# Patient Record
Sex: Male | Born: 1965 | Hispanic: No | Marital: Single | Smoking: Never smoker
Health system: Southern US, Community
[De-identification: ages and names within clinical notes are randomized; demographics above are authoritative.]

## PROBLEM LIST (undated history)

## (undated) DIAGNOSIS — D751 Secondary polycythemia: Secondary | ICD-10-CM

## (undated) DIAGNOSIS — R7989 Other specified abnormal findings of blood chemistry: Secondary | ICD-10-CM

## (undated) DIAGNOSIS — I10 Essential (primary) hypertension: Secondary | ICD-10-CM

## (undated) HISTORY — DX: Other specified abnormal findings of blood chemistry: R79.89

## (undated) HISTORY — DX: Secondary polycythemia: D75.1

## (undated) HISTORY — DX: Essential (primary) hypertension: I10

---

## 2009-07-03 ENCOUNTER — Ambulatory Visit: Payer: Self-pay | Admitting: Oncology

## 2009-07-07 LAB — CBC WITH DIFFERENTIAL/PLATELET
BASO%: 0.3 % (ref 0.0–2.0)
Basophils Absolute: 0 10*3/uL (ref 0.0–0.1)
EOS%: 3.1 % (ref 0.0–7.0)
MCH: 31.9 pg (ref 27.2–33.4)
MCHC: 34.3 g/dL (ref 32.0–36.0)
MCV: 93 fL (ref 79.3–98.0)
MONO%: 7 % (ref 0.0–14.0)
RBC: 5.47 10*6/uL (ref 4.20–5.82)
RDW: 13.1 % (ref 11.0–14.6)
lymph#: 2.1 10*3/uL (ref 0.9–3.3)

## 2009-07-11 LAB — COMPREHENSIVE METABOLIC PANEL
ALT: 37 U/L (ref 0–53)
AST: 28 U/L (ref 0–37)
Albumin: 4.5 g/dL (ref 3.5–5.2)
Alkaline Phosphatase: 63 U/L (ref 39–117)
BUN: 14 mg/dL (ref 6–23)
Chloride: 101 mEq/L (ref 96–112)
Potassium: 3.7 mEq/L (ref 3.5–5.3)
Sodium: 139 mEq/L (ref 135–145)

## 2009-07-11 LAB — HEMOCHROMATOSIS DNA-PCR(C282Y,H63D)

## 2009-07-11 LAB — IRON AND TIBC
%SAT: 32 % (ref 20–55)
TIBC: 349 ug/dL (ref 215–435)
UIBC: 237 ug/dL

## 2009-07-11 LAB — FERRITIN: Ferritin: 684 ng/mL — ABNORMAL HIGH (ref 22–322)

## 2009-07-31 ENCOUNTER — Ambulatory Visit: Payer: Self-pay | Admitting: Oncology

## 2009-08-05 LAB — CBC WITH DIFFERENTIAL/PLATELET
Basophils Absolute: 0 10*3/uL (ref 0.0–0.1)
Eosinophils Absolute: 0.1 10*3/uL (ref 0.0–0.5)
HGB: 16 g/dL (ref 13.0–17.1)
MCV: 92 fL (ref 79.3–98.0)
MONO#: 0.5 10*3/uL (ref 0.1–0.9)
MONO%: 8.4 % (ref 0.0–14.0)
NEUT#: 3.6 10*3/uL (ref 1.5–6.5)
RBC: 5.01 10*6/uL (ref 4.20–5.82)
RDW: 12.4 % (ref 11.0–14.6)
WBC: 6.2 10*3/uL (ref 4.0–10.3)

## 2009-08-05 LAB — SEDIMENTATION RATE: Sed Rate: 2 mm/hr (ref 0–16)

## 2009-08-05 LAB — IRON AND TIBC
%SAT: 36 % (ref 20–55)
Iron: 113 ug/dL (ref 42–165)
TIBC: 314 ug/dL (ref 215–435)
UIBC: 201 ug/dL

## 2009-08-05 LAB — FERRITIN: Ferritin: 654 ng/mL — ABNORMAL HIGH (ref 22–322)

## 2009-12-09 ENCOUNTER — Ambulatory Visit: Payer: Self-pay | Admitting: Oncology

## 2009-12-11 LAB — CBC WITH DIFFERENTIAL/PLATELET
BASO%: 0.3 % (ref 0.0–2.0)
EOS%: 2.1 % (ref 0.0–7.0)
MCH: 31.5 pg (ref 27.2–33.4)
MCHC: 34.3 g/dL (ref 32.0–36.0)
MONO#: 0.6 10*3/uL (ref 0.1–0.9)
RBC: 5.31 10*6/uL (ref 4.20–5.82)
RDW: 12.9 % (ref 11.0–14.6)
WBC: 6.9 10*3/uL (ref 4.0–10.3)
lymph#: 2.3 10*3/uL (ref 0.9–3.3)

## 2009-12-11 LAB — COMPREHENSIVE METABOLIC PANEL
ALT: 40 U/L (ref 0–53)
AST: 35 U/L (ref 0–37)
Albumin: 4 g/dL (ref 3.5–5.2)
Alkaline Phosphatase: 75 U/L (ref 39–117)
Calcium: 9.6 mg/dL (ref 8.4–10.5)
Chloride: 106 mEq/L (ref 96–112)
Potassium: 4 mEq/L (ref 3.5–5.3)

## 2009-12-11 LAB — LACTATE DEHYDROGENASE: LDH: 153 U/L (ref 94–250)

## 2009-12-11 LAB — IRON AND TIBC
%SAT: 39 % (ref 20–55)
TIBC: 327 ug/dL (ref 215–435)

## 2010-03-10 ENCOUNTER — Ambulatory Visit: Payer: Self-pay | Admitting: Oncology

## 2010-03-13 LAB — IRON AND TIBC
%SAT: 40 % (ref 20–55)
UIBC: 217 ug/dL

## 2010-03-13 LAB — FERRITIN: Ferritin: 759 ng/mL — ABNORMAL HIGH (ref 22–322)

## 2010-06-09 ENCOUNTER — Ambulatory Visit: Payer: Self-pay | Admitting: Oncology

## 2010-06-11 LAB — COMPREHENSIVE METABOLIC PANEL
CO2: 26 mEq/L (ref 19–32)
Calcium: 9.7 mg/dL (ref 8.4–10.5)
Chloride: 104 mEq/L (ref 96–112)
Creatinine, Ser: 1.29 mg/dL (ref 0.40–1.50)
Glucose, Bld: 119 mg/dL — ABNORMAL HIGH (ref 70–99)
Total Bilirubin: 1.7 mg/dL — ABNORMAL HIGH (ref 0.3–1.2)
Total Protein: 7.3 g/dL (ref 6.0–8.3)

## 2010-06-11 LAB — CBC WITH DIFFERENTIAL/PLATELET
Eosinophils Absolute: 0.1 10*3/uL (ref 0.0–0.5)
HCT: 49.9 % (ref 38.4–49.9)
HGB: 17.2 g/dL — ABNORMAL HIGH (ref 13.0–17.1)
LYMPH%: 33.6 % (ref 14.0–49.0)
MONO#: 0.5 10*3/uL (ref 0.1–0.9)
NEUT#: 4.2 10*3/uL (ref 1.5–6.5)
NEUT%: 56.9 % (ref 39.0–75.0)
Platelets: 217 10*3/uL (ref 140–400)
WBC: 7.4 10*3/uL (ref 4.0–10.3)

## 2010-06-11 LAB — IRON AND TIBC
Iron: 87 ug/dL (ref 42–165)
UIBC: 233 ug/dL

## 2010-06-11 LAB — LACTATE DEHYDROGENASE: LDH: 151 U/L (ref 94–250)

## 2010-06-11 LAB — FERRITIN: Ferritin: 619 ng/mL — ABNORMAL HIGH (ref 22–322)

## 2010-06-14 ENCOUNTER — Other Ambulatory Visit (HOSPITAL_COMMUNITY): Payer: Self-pay | Admitting: Oncology

## 2010-06-18 ENCOUNTER — Inpatient Hospital Stay (HOSPITAL_COMMUNITY): Admission: RE | Admit: 2010-06-18 | Payer: Self-pay | Source: Ambulatory Visit

## 2010-06-24 ENCOUNTER — Ambulatory Visit (HOSPITAL_COMMUNITY)
Admission: RE | Admit: 2010-06-24 | Discharge: 2010-06-24 | Disposition: A | Discharge status: Home / Self Care | Payer: BC Managed Care – PPO | Source: Ambulatory Visit | Attending: Oncology | Admitting: Oncology

## 2010-06-24 DIAGNOSIS — R799 Abnormal finding of blood chemistry, unspecified: Secondary | ICD-10-CM | POA: Insufficient documentation

## 2010-06-24 DIAGNOSIS — K7689 Other specified diseases of liver: Secondary | ICD-10-CM | POA: Insufficient documentation

## 2010-06-24 DIAGNOSIS — Q619 Cystic kidney disease, unspecified: Secondary | ICD-10-CM | POA: Insufficient documentation

## 2010-07-13 ENCOUNTER — Encounter (HOSPITAL_BASED_OUTPATIENT_CLINIC_OR_DEPARTMENT_OTHER): Payer: BC Managed Care – PPO | Admitting: Oncology

## 2010-07-13 ENCOUNTER — Other Ambulatory Visit (HOSPITAL_COMMUNITY): Payer: Self-pay | Admitting: Oncology

## 2010-07-13 DIAGNOSIS — R799 Abnormal finding of blood chemistry, unspecified: Secondary | ICD-10-CM

## 2010-07-13 DIAGNOSIS — Z79899 Other long term (current) drug therapy: Secondary | ICD-10-CM

## 2010-07-13 LAB — CBC WITH DIFFERENTIAL/PLATELET
Basophils Absolute: 0 10*3/uL (ref 0.0–0.1)
EOS%: 3.5 % (ref 0.0–7.0)
HCT: 48.6 % (ref 38.4–49.9)
HGB: 16.8 g/dL (ref 13.0–17.1)
LYMPH%: 31.4 % (ref 14.0–49.0)
MCH: 31.6 pg (ref 27.2–33.4)
MCHC: 34.5 g/dL (ref 32.0–36.0)
MCV: 91.8 fL (ref 79.3–98.0)
MONO%: 8.4 % (ref 0.0–14.0)
NEUT%: 56.4 % (ref 39.0–75.0)

## 2010-08-06 ENCOUNTER — Other Ambulatory Visit (HOSPITAL_COMMUNITY): Payer: Self-pay | Admitting: Oncology

## 2010-08-06 ENCOUNTER — Encounter (HOSPITAL_BASED_OUTPATIENT_CLINIC_OR_DEPARTMENT_OTHER): Payer: BC Managed Care – PPO | Admitting: Oncology

## 2010-08-06 DIAGNOSIS — R799 Abnormal finding of blood chemistry, unspecified: Secondary | ICD-10-CM

## 2010-08-06 DIAGNOSIS — Z79899 Other long term (current) drug therapy: Secondary | ICD-10-CM

## 2010-08-06 LAB — CBC WITH DIFFERENTIAL/PLATELET
EOS%: 1.9 % (ref 0.0–7.0)
Eosinophils Absolute: 0.1 10*3/uL (ref 0.0–0.5)
LYMPH%: 34.6 % (ref 14.0–49.0)
MCH: 31.7 pg (ref 27.2–33.4)
MCHC: 34.3 g/dL (ref 32.0–36.0)
MCV: 92.4 fL (ref 79.3–98.0)
MONO%: 8.2 % (ref 0.0–14.0)
Platelets: 215 10*3/uL (ref 140–400)
RBC: 5 10*6/uL (ref 4.20–5.82)

## 2010-09-07 ENCOUNTER — Other Ambulatory Visit (HOSPITAL_COMMUNITY): Payer: Self-pay | Admitting: Oncology

## 2010-09-07 ENCOUNTER — Encounter (HOSPITAL_BASED_OUTPATIENT_CLINIC_OR_DEPARTMENT_OTHER): Payer: BC Managed Care – PPO | Admitting: Oncology

## 2010-09-07 DIAGNOSIS — Z79899 Other long term (current) drug therapy: Secondary | ICD-10-CM

## 2010-09-07 DIAGNOSIS — R799 Abnormal finding of blood chemistry, unspecified: Secondary | ICD-10-CM

## 2010-09-07 LAB — COMPREHENSIVE METABOLIC PANEL
BUN: 15 mg/dL (ref 6–23)
CO2: 27 mEq/L (ref 19–32)
Creatinine, Ser: 1.38 mg/dL (ref 0.40–1.50)
Glucose, Bld: 119 mg/dL — ABNORMAL HIGH (ref 70–99)
Total Bilirubin: 1.4 mg/dL — ABNORMAL HIGH (ref 0.3–1.2)

## 2010-09-07 LAB — CBC WITH DIFFERENTIAL/PLATELET
Eosinophils Absolute: 0.1 10*3/uL (ref 0.0–0.5)
HCT: 48.4 % (ref 38.4–49.9)
LYMPH%: 35.6 % (ref 14.0–49.0)
MCV: 93.6 fL (ref 79.3–98.0)
MONO#: 0.5 10*3/uL (ref 0.1–0.9)
MONO%: 7.1 % (ref 0.0–14.0)
NEUT#: 3.6 10*3/uL (ref 1.5–6.5)
NEUT%: 54.7 % (ref 39.0–75.0)
Platelets: 224 10*3/uL (ref 140–400)
WBC: 6.6 10*3/uL (ref 4.0–10.3)

## 2010-09-07 LAB — IRON AND TIBC
Iron: 129 ug/dL (ref 42–165)
TIBC: 378 ug/dL (ref 215–435)
UIBC: 249 ug/dL

## 2010-09-07 LAB — LACTATE DEHYDROGENASE: LDH: 150 U/L (ref 94–250)

## 2010-09-07 LAB — FERRITIN: Ferritin: 246 ng/mL (ref 22–322)

## 2010-10-05 ENCOUNTER — Other Ambulatory Visit (HOSPITAL_COMMUNITY): Payer: Self-pay | Admitting: Oncology

## 2010-10-05 ENCOUNTER — Encounter (HOSPITAL_BASED_OUTPATIENT_CLINIC_OR_DEPARTMENT_OTHER): Payer: BC Managed Care – PPO | Admitting: Oncology

## 2010-10-05 DIAGNOSIS — R799 Abnormal finding of blood chemistry, unspecified: Secondary | ICD-10-CM

## 2010-10-05 LAB — CBC WITH DIFFERENTIAL/PLATELET
Basophils Absolute: 0 10*3/uL (ref 0.0–0.1)
EOS%: 2.8 % (ref 0.0–7.0)
HGB: 16.1 g/dL (ref 13.0–17.1)
MCH: 31.6 pg (ref 27.2–33.4)
MCHC: 34.2 g/dL (ref 32.0–36.0)
MCV: 92.2 fL (ref 79.3–98.0)
MONO%: 7.9 % (ref 0.0–14.0)
RBC: 5.1 10*6/uL (ref 4.20–5.82)
RDW: 12.8 % (ref 11.0–14.6)

## 2010-10-05 LAB — FERRITIN: Ferritin: 164 ng/mL (ref 22–322)

## 2010-11-02 ENCOUNTER — Other Ambulatory Visit (HOSPITAL_COMMUNITY): Payer: Self-pay | Admitting: Oncology

## 2010-11-02 ENCOUNTER — Encounter (HOSPITAL_BASED_OUTPATIENT_CLINIC_OR_DEPARTMENT_OTHER): Payer: BC Managed Care – PPO | Admitting: Oncology

## 2010-11-02 DIAGNOSIS — R799 Abnormal finding of blood chemistry, unspecified: Secondary | ICD-10-CM

## 2010-11-30 ENCOUNTER — Other Ambulatory Visit (HOSPITAL_COMMUNITY): Payer: Self-pay | Admitting: Oncology

## 2010-11-30 ENCOUNTER — Encounter (HOSPITAL_BASED_OUTPATIENT_CLINIC_OR_DEPARTMENT_OTHER): Payer: BC Managed Care – PPO | Admitting: Oncology

## 2010-11-30 DIAGNOSIS — R799 Abnormal finding of blood chemistry, unspecified: Secondary | ICD-10-CM

## 2010-11-30 LAB — COMPREHENSIVE METABOLIC PANEL
ALT: 33 U/L (ref 0–53)
AST: 26 U/L (ref 0–37)
Albumin: 3.9 g/dL (ref 3.5–5.2)
Alkaline Phosphatase: 81 U/L (ref 39–117)
Potassium: 3.5 mEq/L (ref 3.5–5.3)
Sodium: 140 mEq/L (ref 135–145)
Total Protein: 7.3 g/dL (ref 6.0–8.3)

## 2010-11-30 LAB — CBC WITH DIFFERENTIAL/PLATELET
EOS%: 1.9 % (ref 0.0–7.0)
Eosinophils Absolute: 0.1 10*3/uL (ref 0.0–0.5)
MCH: 31 pg (ref 27.2–33.4)
MCV: 91.4 fL (ref 79.3–98.0)
MONO%: 7.5 % (ref 0.0–14.0)
NEUT#: 3.9 10*3/uL (ref 1.5–6.5)
RBC: 5.16 10*6/uL (ref 4.20–5.82)
RDW: 13.2 % (ref 11.0–14.6)

## 2010-11-30 LAB — IRON AND TIBC
%SAT: 34 % (ref 20–55)
TIBC: 385 ug/dL (ref 215–435)

## 2011-02-03 ENCOUNTER — Other Ambulatory Visit (HOSPITAL_COMMUNITY): Payer: Self-pay | Admitting: Oncology

## 2011-02-03 ENCOUNTER — Encounter (HOSPITAL_BASED_OUTPATIENT_CLINIC_OR_DEPARTMENT_OTHER): Payer: BC Managed Care – PPO | Admitting: Oncology

## 2011-02-03 DIAGNOSIS — R799 Abnormal finding of blood chemistry, unspecified: Secondary | ICD-10-CM

## 2011-02-03 LAB — CBC WITH DIFFERENTIAL/PLATELET
Basophils Absolute: 0 10*3/uL (ref 0.0–0.1)
EOS%: 3 % (ref 0.0–7.0)
HCT: 49.6 % (ref 38.4–49.9)
HGB: 17.1 g/dL (ref 13.0–17.1)
MONO#: 0.6 10*3/uL (ref 0.1–0.9)
NEUT#: 3.4 10*3/uL (ref 1.5–6.5)
NEUT%: 52.4 % (ref 39.0–75.0)
RDW: 13.1 % (ref 11.0–14.6)
WBC: 6.4 10*3/uL (ref 4.0–10.3)
lymph#: 2.3 10*3/uL (ref 0.9–3.3)

## 2011-03-31 ENCOUNTER — Other Ambulatory Visit (HOSPITAL_COMMUNITY): Payer: Self-pay | Admitting: Oncology

## 2011-03-31 ENCOUNTER — Other Ambulatory Visit (HOSPITAL_BASED_OUTPATIENT_CLINIC_OR_DEPARTMENT_OTHER): Payer: BC Managed Care – PPO | Admitting: Lab

## 2011-03-31 DIAGNOSIS — R799 Abnormal finding of blood chemistry, unspecified: Secondary | ICD-10-CM

## 2011-03-31 LAB — CBC WITH DIFFERENTIAL/PLATELET
Basophils Absolute: 0 10*3/uL (ref 0.0–0.1)
Eosinophils Absolute: 0.1 10*3/uL (ref 0.0–0.5)
HGB: 17 g/dL (ref 13.0–17.1)
LYMPH%: 36.8 % (ref 14.0–49.0)
MCV: 92.3 fL (ref 79.3–98.0)
MONO#: 0.6 10*3/uL (ref 0.1–0.9)
MONO%: 8.7 % (ref 0.0–14.0)
NEUT#: 3.8 10*3/uL (ref 1.5–6.5)
Platelets: 224 10*3/uL (ref 140–400)
RBC: 5.49 10*6/uL (ref 4.20–5.82)
RDW: 13.1 % (ref 11.0–14.6)
WBC: 7.2 10*3/uL (ref 4.0–10.3)

## 2011-03-31 LAB — FERRITIN: Ferritin: 149 ng/mL (ref 22–322)

## 2011-04-05 ENCOUNTER — Other Ambulatory Visit: Payer: Self-pay | Admitting: Oncology

## 2011-05-14 ENCOUNTER — Telehealth: Payer: Self-pay | Admitting: Oncology

## 2011-05-14 NOTE — Telephone Encounter (Signed)
na at above #,appt letter mailed to pt for 06/28/11 appt per mosaig    aom

## 2011-06-28 ENCOUNTER — Encounter: Payer: Self-pay | Admitting: Oncology

## 2011-06-28 ENCOUNTER — Ambulatory Visit (HOSPITAL_BASED_OUTPATIENT_CLINIC_OR_DEPARTMENT_OTHER): Payer: BC Managed Care – PPO | Admitting: Oncology

## 2011-06-28 ENCOUNTER — Other Ambulatory Visit: Payer: BC Managed Care – PPO | Admitting: Lab

## 2011-06-28 DIAGNOSIS — R799 Abnormal finding of blood chemistry, unspecified: Secondary | ICD-10-CM

## 2011-06-28 DIAGNOSIS — R7989 Other specified abnormal findings of blood chemistry: Secondary | ICD-10-CM | POA: Insufficient documentation

## 2011-06-28 DIAGNOSIS — D751 Secondary polycythemia: Secondary | ICD-10-CM | POA: Insufficient documentation

## 2011-06-28 LAB — CBC WITH DIFFERENTIAL/PLATELET
BASO%: 0.3 % (ref 0.0–2.0)
EOS%: 1.6 % (ref 0.0–7.0)
Eosinophils Absolute: 0.1 10*3/uL (ref 0.0–0.5)
LYMPH%: 35.2 % (ref 14.0–49.0)
MCH: 31.4 pg (ref 27.2–33.4)
MCHC: 34 g/dL (ref 32.0–36.0)
MCV: 92.3 fL (ref 79.3–98.0)
MONO%: 9.6 % (ref 0.0–14.0)
NEUT#: 4.3 10*3/uL (ref 1.5–6.5)
Platelets: 211 10*3/uL (ref 140–400)
RBC: 5.51 10*6/uL (ref 4.20–5.82)
RDW: 12.9 % (ref 11.0–14.6)

## 2011-06-28 LAB — IRON AND TIBC
Iron: 108 ug/dL (ref 42–165)
TIBC: 362 ug/dL (ref 215–435)
UIBC: 254 ug/dL (ref 125–400)

## 2011-06-28 LAB — COMPREHENSIVE METABOLIC PANEL
AST: 32 U/L (ref 0–37)
Albumin: 3.9 g/dL (ref 3.5–5.2)
Alkaline Phosphatase: 73 U/L (ref 39–117)
Glucose, Bld: 105 mg/dL — ABNORMAL HIGH (ref 70–99)
Potassium: 3.3 mEq/L — ABNORMAL LOW (ref 3.5–5.3)
Sodium: 141 mEq/L (ref 135–145)
Total Bilirubin: 1 mg/dL (ref 0.3–1.2)
Total Protein: 7.8 g/dL (ref 6.0–8.3)

## 2011-06-28 LAB — FERRITIN: Ferritin: 198 ng/mL (ref 22–322)

## 2011-06-28 NOTE — Progress Notes (Signed)
This office note has been dictated.  #[id number]

## 2011-06-29 ENCOUNTER — Telehealth: Payer: Self-pay | Admitting: Oncology

## 2011-06-29 ENCOUNTER — Other Ambulatory Visit: Payer: Self-pay

## 2011-06-29 ENCOUNTER — Other Ambulatory Visit: Payer: Self-pay | Admitting: Oncology

## 2011-06-29 DIAGNOSIS — R7989 Other specified abnormal findings of blood chemistry: Secondary | ICD-10-CM

## 2011-06-29 NOTE — Clinical Documentation Improvement (Signed)
Ferritin from 06/28/11 was 198, up from 149 on 03/31/11.  I recommended that patient have a phlebotomy.  To be done possibly today, 06/29/11.

## 2011-06-29 NOTE — Progress Notes (Signed)
CC:   Jose Shanks, MD  PROBLEM LIST: 1. Elevated ferritin level, possibly due to hemochromatosis although     the HFE gene mutations, specifically the C2A2Y and H63D mutations     were not detected.  The patient has undergone 1 unit phlebotomies     beginning on June 15, 2010, 07/13/2010, 08/06/2010, 10/05/2010,     11/02/2010, and 11/30/2010.  Ferritin level on 03/12/2010 was 759.     It was felt that the patient had hemochromatosis on the basis of a     rising ferritin level without obvious explanation.  The patient     denied any supplemental iron, and we had no other alternative for     the elevated ferritin.  Specifically, there was no evidence for     infection, inflammation or cancer. 2. Erythrocytosis detected February 2011.  JAK2 mutation was not     detected.  Abdominal ultrasound on 06/24/2010 did not show     splenomegaly. 3. Hypertension. 4. GERD.  MEDICATIONS: 1. Chlorthalidone 25 mg daily. 2. Tylenol as needed.  HISTORY:  Jose Stanley returns for evaluation of his elevated ferritin level which we believe is due to hemochromatosis despite the absence of mutations in the HFE gene.  Jose Stanley was last seen by [place] on 11/30/2010 at the time of his most recent phlebotomy, his 7th since starting on a phlebotomy program on 06/15/2010.  At that time, Jose Stanley did not wish to have any more phlebotomies.  He wanted to monitor his labs every couple of months, and follow up with [place] 6 months later.  He denies any changes in his condition.  He is a Naval architect for [employer].  PHYSICAL EXAMINATION:  General:  There is little change.  Vital Signs: Weight is 242 pounds.  Height 5 feet 9 inches.  Body surface area 2.31 sq m.  Blood pressure 132/82.  Other vital signs are normal. Temperature is 99.2.  HEENT:  There is conjunctival injection bilaterally.  Mouth and pharynx are benign.  No peripheral adenopathy palpable.  Heart and lungs are normal.  Abdomen:  Soft, nontender with no  organomegaly or masses palpable.  Extremities:  No peripheral edema. Neurologic:  Exam is grossly normal.  LABORATORY DATA:  Today, white count 8.0, ANC 4.3, hemoglobin 17.3, hematocrit 50.8, platelets 211,000.  MCV and MCH are normal. Chemistries today notable for BUN of 15, creatinine 1.52, potassium 3.3. Liver function tests are normal.  Albumin 3.9.  LDH 171.  Iron studies are pending.  Ferritin on 03/31/2011 was 149 as compared with 86 on 02/03/2011, 90 on 11/30/2010, 120 on 11/02/2010, 164 on 10/05/2010, 246 on 09/07/2010, 410 on 08/06/2010, 480 on 07/13/2010, 619 on 06/11/2010, 759 on 03/12/2010, and 684 on 07/07/2009.  IMAGING STUDIES:  Ultrasound of the abdomen on 06/24/2010 showed a normal size spleen measuring 6.7 cm.  Right kidney was normal.  There was a single simple cyst measuring 1.3 cm in the midpole of the left kidney without hydronephrosis or stone.  Liver showed no focal lesions. The right hepatic lobe demonstrated increased echogenicity.  The left hepatic lobe was not well visualized.  IMPRESSION AND PLAN:  We are awaiting the results of today's iron studies.  If it appears that the ferritin is increasing, particularly if it is in the vicinity of 200, then we will be arranging for Jose Stanley to undergo additional phlebotomies.  He is agreeable to this.  He would like to be called with his lab results.  Jose Stanley wanted to have his  iron studies followed through his primary care physician.  I suggested that those results be faxed to [place].  We arranged for follow-up appointment in 6 months at which time we will check CBC, chemistries, and iron studies.  Once again we reviewed possible iron supplements.  Jose Stanley not only does not take iron supplements but he has actually tried to eliminate meat and other potential dietary sources of iron from his intake.  The trends that we have seen in [name]'s iron particularly the upward trend now that we have stopped the phlebotomy program  suggests to me the possibility of increased abnormal iron absorption.  Ferritin came back at 198.  Jose Stanley will have a 1 unit phlebotomy on or about 07/01/11.  He will have a CBC and ferritin in 6-8 weeks.  ______________________________ Samul Dada, M.D. DSM/MEDQ  D:  06/28/2011  T:  06/29/2011  Job:  [id number]

## 2011-06-29 NOTE — Telephone Encounter (Signed)
called pt with phleb appt on 2/15 and lab md on 8/12  aom

## 2011-07-01 ENCOUNTER — Ambulatory Visit (HOSPITAL_BASED_OUTPATIENT_CLINIC_OR_DEPARTMENT_OTHER): Payer: BC Managed Care – PPO

## 2011-07-01 DIAGNOSIS — R7989 Other specified abnormal findings of blood chemistry: Secondary | ICD-10-CM

## 2011-07-01 DIAGNOSIS — D759 Disease of blood and blood-forming organs, unspecified: Secondary | ICD-10-CM

## 2011-07-01 NOTE — Progress Notes (Signed)
Phlebotomy done at 1600.  200cc obtained from left AC, and clotted off.  2nd stick done in left forearm, additional 300cc obtained for a total of 500 cc blood.

## 2011-12-26 ENCOUNTER — Other Ambulatory Visit: Payer: BC Managed Care – PPO | Admitting: Lab

## 2011-12-26 ENCOUNTER — Ambulatory Visit: Payer: BC Managed Care – PPO | Admitting: Family

## 2011-12-26 ENCOUNTER — Encounter: Payer: Self-pay | Admitting: Family

## 2012-03-22 ENCOUNTER — Encounter: Payer: Self-pay | Admitting: Oncology

## 2012-03-22 ENCOUNTER — Telehealth: Payer: Self-pay | Admitting: Oncology

## 2012-03-22 ENCOUNTER — Other Ambulatory Visit: Payer: Self-pay

## 2012-03-22 NOTE — Progress Notes (Signed)
This patient was last seen by me on 06/28/2011 and underwent a phlebotomy of one unit on February 15. He was a no-show on 12/26/2011.  We have labs sent to [place] from Iona Hansen NP: From 03/19/2012, ferritin was 191.1, hemoglobin was 17.5 with normal being 11.9-15.8 and hematocrit was 52.5 with normal being 35-60.  An appointment was made for the patient to see me on November 15. I don't think we have ordered any labs that date. We plan to discuss management with the patient and then if he is agreeable to having more phlebotomies, we will probably order more labs to recheck these values.

## 2012-03-22 NOTE — Telephone Encounter (Signed)
lmonvm adviisng the pt of his appt with dr Arline Asp on 03/30/2012

## 2012-03-30 ENCOUNTER — Encounter: Payer: Self-pay | Admitting: Oncology

## 2012-03-30 ENCOUNTER — Telehealth: Payer: Self-pay | Admitting: Oncology

## 2012-03-30 ENCOUNTER — Other Ambulatory Visit: Payer: BC Managed Care – PPO | Admitting: Lab

## 2012-03-30 ENCOUNTER — Ambulatory Visit (HOSPITAL_BASED_OUTPATIENT_CLINIC_OR_DEPARTMENT_OTHER): Payer: BC Managed Care – PPO | Admitting: Oncology

## 2012-03-30 ENCOUNTER — Ambulatory Visit (HOSPITAL_BASED_OUTPATIENT_CLINIC_OR_DEPARTMENT_OTHER): Payer: BC Managed Care – PPO

## 2012-03-30 VITALS — BP 118/72 | HR 100 | Temp 98.4°F | Resp 20 | Ht 69.0 in | Wt 236.0 lb

## 2012-03-30 VITALS — BP 116/80 | HR 99 | Temp 97.9°F | Resp 20

## 2012-03-30 DIAGNOSIS — R7989 Other specified abnormal findings of blood chemistry: Secondary | ICD-10-CM

## 2012-03-30 NOTE — Progress Notes (Signed)
This office note has been dictated.  #[id number]

## 2012-03-30 NOTE — Progress Notes (Signed)
CC:   Jose Shanks, MD  PROBLEM LIST:  1. Elevated ferritin level, possibly due to hemochromatosis although  the HFE gene mutations, specifically the C2A2Y and H63D mutations  were not detected. The patient has undergone 1 unit phlebotomies  beginning on June 15, 2010, 07/13/2010, 08/06/2010, 10/05/2010,  11/02/2010, and 11/30/2010. Ferritin level on 03/12/2010 was 759.  It was felt that the patient had hemochromatosis on the basis of a  rising ferritin level without obvious explanation. The patient  denied any supplemental iron, and we had no other alternative for  the elevated ferritin. Specifically, there was no evidence for  infection, inflammation or cancer.  2. Erythrocytosis detected February 2011. JAK2 mutation was not  detected. Abdominal ultrasound on 06/24/2010 did not show  splenomegaly.  3. Hypertension.  4. GERD. 5. Mild renal insufficiency.   MEDICATIONS:  1. Chlorthalidone 25 mg daily.  2. Tylenol as needed.  SMOKING HISTORY:  The patient has never smoked cigarettes.   HISTORY:  I saw Jose Stanley for evaluation of his elevated ferritin level which we believe is due to hemochromatosis, despite the absence of mutations in the HFE gene.  Jose Stanley was last seen by [place] on 06/28/2011, at which time his ferritin level came back 198.  He underwent a 1-unit phlebotomy on 07/01/2011.  The patient has been lost to our followup.  I believe he was supposed to have repeat labs in April.  We were supposed to see him again in August.  The patient tells me that he has been followed by his primary physician.  He states that in March his ferritin came back 90.  The patient's nurse practitioner, Iona Hansen, sent over labs from March 19, 2012.  Ferritin was 191.1, hemoglobin 17.5 with normal being 11.9-15.8.  The hematocrit was 52.5 with normal being 35-60.  We contacted the patient and requested that he come in today for discussion and reevaluation.  PHYSICAL EXAM:   General:  He looks well.  Weight is 236 pounds.  Height 5 feet 9 inches, body surface area 2.28 m2.  Vital Signs:  Blood pressure 118/72.  Other vital signs are normal.  HEENT:  There is no scleral icterus.  There was conjunctival injection bilaterally.  Mouth and pharynx are benign.  There is no peripheral adenopathy palpable. Heart and lungs:  Normal.  Abdomen:  Obese, nontender with no organomegaly or masses palpable.  Extremities:  No peripheral edema. Neurologic:  Normal.  LABORATORY DATA:  None was carried out today.  On 06/28/2011 when the patient was last seen by [place] his white count was 8.0 with an ANC of 4.3, hemoglobin 17.3, hematocrit 50.8, platelets 211,000.  Ferritin was 198, iron saturation 30%.  On 03/31/2011 ferritin had been 149 and on 02/03/2011 ferritin had been 86.  On 11/30/2010 ferritin had been 90 with an iron saturation of 34%.  Chemistry profile on 06/28/2011 notable for creatinine of 1.52, BUN 15, potassium 3.3.  The patient was given a copy of his labs.  I pointed out to him that his creatinine was slightly elevated and that this needed followup.  IMAGING STUDIES:  1. Ultrasound of the abdomen on 06/24/2010 showed a  normal size spleen measuring 6.7 cm. Right kidney was normal. There  was a single simple cyst measuring 1.3 cm in the midpole of the left  kidney without hydronephrosis or stone. Liver showed no focal lesions.  The right hepatic lobe demonstrated increased echogenicity. The left  hepatic lobe was not well visualized.  IMPRESSION AND PLAN:  Patient is agreeable to undergoing a 1-unit phlebotomy today.  It was my plan to keep the ferritin level below 100, or even ideally less than 50.  After some discussion, the patient was agreeable to coming back in about 3 months.  We will check his labs, specifically CBC, chemistries, and iron studies around June 22, 2012, and plan to see the patient again without any labs on February 14th.  If the  ferritin level is still higher than we want, we will arrange for a 1- unit phlebotomy when we see him again on February 14th.  As stated, the patient was given a copy of labs from June 28, 2011, and I pointed out to him the fact that his creatinine was somewhat elevated and that if this continues to be elevated or the creatinine seemed to be increasing, then he might need to see a kidney specialist.    ______________________________ Samul Dada, M.D. DSM/MEDQ  D:  03/30/2012  T:  03/30/2012  Job:  [id number]

## 2012-03-30 NOTE — Telephone Encounter (Signed)
gv pt appt schedule for February 2014. °

## 2012-03-30 NOTE — Progress Notes (Signed)
Therapeutic phlebotomy performed.  520g removed via phlebotomy set.

## 2012-03-30 NOTE — Patient Instructions (Signed)

## 2012-06-06 ENCOUNTER — Telehealth: Payer: Self-pay | Admitting: Oncology

## 2012-06-06 NOTE — Telephone Encounter (Signed)
2/17 appt moved to 2/18 poof. S/w pt's wife re new appt d/t for 2/18. Also confirmed appt for 2/7 and mailed schedule.

## 2012-06-22 ENCOUNTER — Other Ambulatory Visit (HOSPITAL_BASED_OUTPATIENT_CLINIC_OR_DEPARTMENT_OTHER): Payer: BC Managed Care – PPO | Admitting: Lab

## 2012-06-22 DIAGNOSIS — R7989 Other specified abnormal findings of blood chemistry: Secondary | ICD-10-CM

## 2012-06-22 LAB — IRON AND TIBC
Iron: 129 ug/dL (ref 42–165)
TIBC: 364 ug/dL (ref 215–435)
UIBC: 235 ug/dL (ref 125–400)

## 2012-06-22 LAB — COMPREHENSIVE METABOLIC PANEL (CC13)
Alkaline Phosphatase: 65 U/L (ref 40–150)
BUN: 17.2 mg/dL (ref 7.0–26.0)
CO2: 25 mEq/L (ref 22–29)
Creatinine: 1.3 mg/dL (ref 0.7–1.3)
Glucose: 94 mg/dl (ref 70–99)
Sodium: 140 mEq/L (ref 136–145)
Total Bilirubin: 1.26 mg/dL — ABNORMAL HIGH (ref 0.20–1.20)
Total Protein: 7.3 g/dL (ref 6.4–8.3)

## 2012-06-22 LAB — CBC WITH DIFFERENTIAL/PLATELET
BASO%: 0.4 % (ref 0.0–2.0)
EOS%: 2.1 % (ref 0.0–7.0)
HCT: 49.4 % (ref 38.4–49.9)
HGB: 16.9 g/dL (ref 13.0–17.1)
MCHC: 34.3 g/dL (ref 32.0–36.0)
MONO#: 0.6 10*3/uL (ref 0.1–0.9)
NEUT%: 49.6 % (ref 39.0–75.0)
RDW: 12.9 % (ref 11.0–14.6)
WBC: 6.2 10*3/uL (ref 4.0–10.3)
lymph#: 2.3 10*3/uL (ref 0.9–3.3)

## 2012-06-26 ENCOUNTER — Ambulatory Visit: Payer: BC Managed Care – PPO | Admitting: Oncology

## 2012-06-29 ENCOUNTER — Telehealth: Payer: Self-pay | Admitting: Oncology

## 2012-06-29 NOTE — Telephone Encounter (Signed)
lmonvm for pt re new appt for 2/25. appt moved from 2/18 [facility] to [facility]. Message left on [phone]. Not able to reach pt at home number or lm. Mailed schedule.

## 2012-07-02 ENCOUNTER — Ambulatory Visit: Payer: BC Managed Care – PPO | Admitting: Oncology

## 2012-07-03 ENCOUNTER — Ambulatory Visit: Payer: BC Managed Care – PPO | Admitting: Family

## 2012-07-05 ENCOUNTER — Telehealth: Payer: Self-pay | Admitting: Medical Oncology

## 2012-07-05 NOTE — Telephone Encounter (Signed)
Pt called this am and want to move his appointments out to June. I explained to pt that I do not think this is a good idea because Dr. Arline Asp is watching his ferritin and doing phlebotomy. He states he is upset because he always has to wait for the MD and his time is valuable. He asked can he come in for labs and phlebotomy and move his appointment to June. I told him I will discuss with Dr. Arline Asp and call him back.

## 2012-07-06 ENCOUNTER — Telehealth: Payer: Self-pay | Admitting: Medical Oncology

## 2012-07-06 ENCOUNTER — Other Ambulatory Visit: Payer: Self-pay | Admitting: Medical Oncology

## 2012-07-06 DIAGNOSIS — R7989 Other specified abnormal findings of blood chemistry: Secondary | ICD-10-CM

## 2012-07-06 DIAGNOSIS — D751 Secondary polycythemia: Secondary | ICD-10-CM

## 2012-07-06 NOTE — Telephone Encounter (Signed)
I called pt and spoke with his wife (pt is working) to let him know that Dr. Arline Asp is ok with him getting a lab and phlebotomy and moving his lab/MD to June. I told her we will cancel his appointment for Monday and call him with new dates and times. She voiced understanding.

## 2012-07-08 ENCOUNTER — Telehealth: Payer: Self-pay | Admitting: Oncology

## 2012-07-08 NOTE — Telephone Encounter (Signed)
S/w pt re appt for 6/5.

## 2012-07-10 ENCOUNTER — Ambulatory Visit: Payer: BC Managed Care – PPO | Admitting: Oncology

## 2012-10-18 ENCOUNTER — Encounter: Payer: Self-pay | Admitting: Oncology

## 2012-10-18 ENCOUNTER — Other Ambulatory Visit (HOSPITAL_BASED_OUTPATIENT_CLINIC_OR_DEPARTMENT_OTHER): Payer: BC Managed Care – PPO | Admitting: Lab

## 2012-10-18 ENCOUNTER — Ambulatory Visit (HOSPITAL_BASED_OUTPATIENT_CLINIC_OR_DEPARTMENT_OTHER): Payer: BC Managed Care – PPO

## 2012-10-18 ENCOUNTER — Ambulatory Visit (HOSPITAL_BASED_OUTPATIENT_CLINIC_OR_DEPARTMENT_OTHER): Payer: BC Managed Care – PPO | Admitting: Oncology

## 2012-10-18 VITALS — BP 139/83 | HR 83 | Temp 97.0°F | Resp 18 | Ht 69.0 in | Wt 247.2 lb

## 2012-10-18 DIAGNOSIS — D751 Secondary polycythemia: Secondary | ICD-10-CM

## 2012-10-18 DIAGNOSIS — R7989 Other specified abnormal findings of blood chemistry: Secondary | ICD-10-CM

## 2012-10-18 LAB — CBC WITH DIFFERENTIAL/PLATELET
Basophils Absolute: 0 10*3/uL (ref 0.0–0.1)
Eosinophils Absolute: 0.2 10*3/uL (ref 0.0–0.5)
HCT: 47.2 % (ref 38.4–49.9)
HGB: 16.6 g/dL (ref 13.0–17.1)
LYMPH%: 37.6 % (ref 14.0–49.0)
MCHC: 35.2 g/dL (ref 32.0–36.0)
MONO#: 0.5 10*3/uL (ref 0.1–0.9)
NEUT#: 2.8 10*3/uL (ref 1.5–6.5)
NEUT%: 49.8 % (ref 39.0–75.0)
Platelets: 202 10*3/uL (ref 140–400)
WBC: 5.6 10*3/uL (ref 4.0–10.3)

## 2012-10-18 LAB — COMPREHENSIVE METABOLIC PANEL (CC13)
ALT: 46 U/L (ref 0–55)
AST: 34 U/L (ref 5–34)
Albumin: 3.6 g/dL (ref 3.5–5.0)
Alkaline Phosphatase: 63 U/L (ref 40–150)
BUN: 8.2 mg/dL (ref 7.0–26.0)
Calcium: 9.9 mg/dL (ref 8.4–10.4)
Chloride: 106 mEq/L (ref 98–107)
Potassium: 3.6 mEq/L (ref 3.5–5.1)
Sodium: 140 mEq/L (ref 136–145)

## 2012-10-18 LAB — IRON AND TIBC
%SAT: 40 % (ref 20–55)
TIBC: 336 ug/dL (ref 215–435)
UIBC: 201 ug/dL (ref 125–400)

## 2012-10-18 NOTE — Patient Instructions (Addendum)
Therapeutic Phlebotomy Therapeutic phlebotomy is the controlled removal of blood from your body for the purpose of treating a medical condition. It is similar to donating blood. Usually, about a pint (470 mL) of blood is removed. The average adult has 9 to 12 pints (4.3 to 5.7 L) of blood. Therapeutic phlebotomy may be used to treat the following medical conditions:  Hemochromatosis. This is a condition in which there is too much iron in the blood.  Polycythemia vera. This is a condition in which there are too many red cells in the blood.  Porphyria cutanea tarda. This is a disease usually passed from one generation to the next (inherited). It is a condition in which an important part of hemoglobin is not made properly. This results in the build up of abnormal amounts of porphyrins in the body.  Sickle cell disease. This is an inherited disease. It is a condition in which the red blood cells form an abnormal crescent shape rather than a round shape. LET YOUR CAREGIVER KNOW ABOUT:  Allergies.  Medicines taken including herbs, eyedrops, over-the-counter medicines, and creams.  Use of steroids (by mouth or creams).  Previous problems with anesthetics or numbing medicine.  History of blood clots.  History of bleeding or blood problems.  Previous surgery.  Possibility of pregnancy, if this applies. RISKS AND COMPLICATIONS This is a simple and safe procedure. Problems are unlikely. However, problems can occur and may include:  Nausea or lightheadedness.  Low blood pressure.  Soreness, bleeding, swelling, or bruising at the needle insertion site.  Infection. BEFORE THE PROCEDURE  This is a procedure that can be done as an outpatient. Confirm the time that you need to arrive for your procedure. Confirm whether there is a need to fast or withhold any medications. It is helpful to wear clothing with sleeves that can be raised above the elbow. A blood sample may be done to determine the  amount of red blood cells or iron in your blood. Plan ahead of time to have someone drive you home after the procedure. PROCEDURE The entire procedure from preparation through recovery takes about 1 hour. The actual collection takes about 10 to 15 minutes.  A needle will be inserted into your vein.  Tubing and a collection bag will be attached to that needle.  Blood will flow through the needle and tubing into the collection bag.  You may be asked to open and close your hand slowly and continuously during the entire collection.  Once the specified amount of blood has been removed from your body, the collection bag and tubing will be clamped.  The needle will be removed.  Pressure will be held on the site of the needle insertion to stop the bleeding. Then a bandage will be placed over the needle insertion site. AFTER THE PROCEDURE  Your recovery will be assessed and monitored. If there are no problems, as an outpatient, you should be able to go home shortly after the procedure.  Document Released: 10/04/2010 Document Revised: 07/25/2011 Document Reviewed: 10/04/2010 [employer] Patient Information 2014 [employer].  

## 2012-10-18 NOTE — Progress Notes (Signed)
This office note has been dictated.  #[id number]

## 2012-10-19 ENCOUNTER — Telehealth: Payer: Self-pay | Admitting: Oncology

## 2012-10-19 NOTE — Progress Notes (Signed)
CC:   [name], MD  PROBLEM LIST:  1. Elevated ferritin level, possibly due to hemochromatosis although  the HFE gene mutations, specifically the C2A2Y and H63D mutations  were not detected. The patient has undergone 1 unit phlebotomies  beginning on June 15, 2010, 07/13/2010, 08/06/2010, 10/05/2010,  11/02/2010, and 11/30/2010. Ferritin level on 03/12/2010 was 759.  It was felt that the patient had hemochromatosis on the basis of a  rising ferritin level without obvious explanation. The patient  denied any supplemental iron, and we had no other alternative for  the elevated ferritin. Specifically, there was no evidence for  infection, inflammation or cancer.  2. Erythrocytosis detected February 2011. JAK2 mutation was not  detected. Abdominal ultrasound on 06/24/2010 did not show  splenomegaly.  3. Hypertension.  4. GERD.  5. Mild renal insufficiency. 6. 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   MEDICATIONS:  Reviewed and recorded. Current Outpatient Prescriptions  Medication Sig Dispense Refill  . acetaminophen (TYLENOL) 500 MG tablet Take 500 mg by mouth every 6 (six) hours as needed.      . chlorthalidone (HYGROTON) 25 MG tablet Take 25 mg by mouth daily.       No current facility-administered medications for this visit.     SMOKING HISTORY:  The patient has never smoked cigarettes.    HISTORY:  Jose Stanley was seen today for followup of his elevated ferritin which we believe is secondary to hemochromatosis despite the absence of mutations in the HFE gene.  Jose Stanley was last seen by [place] on 03/30/2012.  He underwent a 1 unit phlebotomy on that date.  Apparently labs were carried out outside of the system and thus they are not available in the system.  However, according to my note from 03/30/2012 labs from 03/19/2012 showed a ferritin of 191 and a hemoglobin of 17.5. It was for that reason the patient did undergo a 1 unit phlebotomy at that  time.  We have labs from 06/22/2012 showing a ferritin of 124 and an iron saturation of 35%.  The patient is here today for further re- evaluation.  The patient was empirically scheduled for phlebotomy and the patient is interested in having a phlebotomy today.  Clinically he feels fine.  He drives an 29 wheeler in the [place] the [place].  He is without any complaints today.  Of note is the fact that his wife is a Engineer, civil (consulting) and his son is a Teacher, early years/pre.  PHYSICAL EXAMINATION:  The patient looks well.  Weight is 247 pounds 3.2 ounces.  Height 5 feet 9 inches.  Body surface area 2.34 m2.  Blood pressure 139/83.  Other vital signs are normal.  There is no scleral icterus.  Mouth and pharynx are benign.  There is no peripheral adenopathy palpable.  Heart and lungs are normal.  Abdomen obese, nontender with no organomegaly or masses palpable.  Extremities:  No peripheral edema or clubbing.  Neurologic exam was normal.  LABORATORY DATA:  Today, white count 5.6, ANC 2.8, hemoglobin 16.6, hematocrit 47.2, platelets 202,000.  Chemistries today notable for a glucose of 134 and a bilirubin of 1.38.  The patient has had elevated bilirubins fairly consistently in the past.  Albumin 3.6, LDH 170, BUN 8, creatinine 1.2.  On 06/28/2011 the creatinine was 1.52.  IMAGING STUDIES:  1. Ultrasound of the abdomen on 06/24/2010 showed a  normal size spleen measuring 6.7 cm. Right kidney was normal. There  was a single simple cyst measuring 1.3 cm in the  midpole of the left  kidney without hydronephrosis or stone. Liver showed no focal lesions.  The right hepatic lobe demonstrated increased echogenicity. The left  hepatic lobe was not well visualized.   IMPRESSION AND PLAN:  The patient is agreeable and actually quite anxious to have a phlebotomy today.  We are still waiting for the iron studies that were drawn today but as stated the last ferritin was 124 with an iron saturation of 35% on  06/22/2012.  We will check CBC and ferritin in 3 months which will be around September 12.  If the ferritin is greater than 100, the patient will be contacted and he will come back for phlebotomy.  I should mention that he tolerates his 1 unit phlebotomies without any problems.  I have set the patient up for labs, physician appointment and possible phlebotomy to occur in 6 months, which will be around December 5.  We once again reviewed the rationale behind the patient's phlebotomy program.  He understands that no mutations were found and that I cannot be absolutely positive that he has hemochromatosis but on the other hand he did have a significantly elevated ferritin for no obvious explanation and it appeared that the ferritin was slowly increasing when we observed labs for several months.    ______________________________ Samul Dada, M.D. DSM/MEDQ  D:  10/18/2012  T:  10/19/2012  Job:  [id number]

## 2012-10-19 NOTE — Telephone Encounter (Signed)
S/w pt re appts for 9/12 and 12/5. Per pt request schedule/avs mailed .

## 2012-11-18 IMAGING — US US ABDOMEN COMPLETE
1 series · 14 of 25 positions shown · non-contrast
Comparison: None.

CLINICAL DATA: Hepatosplenomegaly.  Increased iron levels.

COMPLETE ABDOMINAL ULTRASOUND

[Series 1: us abdomen complete · 0.33mm/px · 14 of 83 slices shown]
[im 1/83]
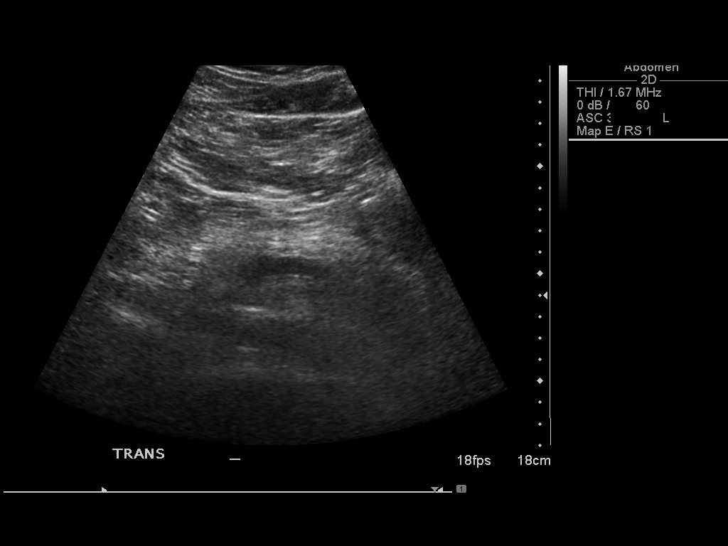
[im 7/83]
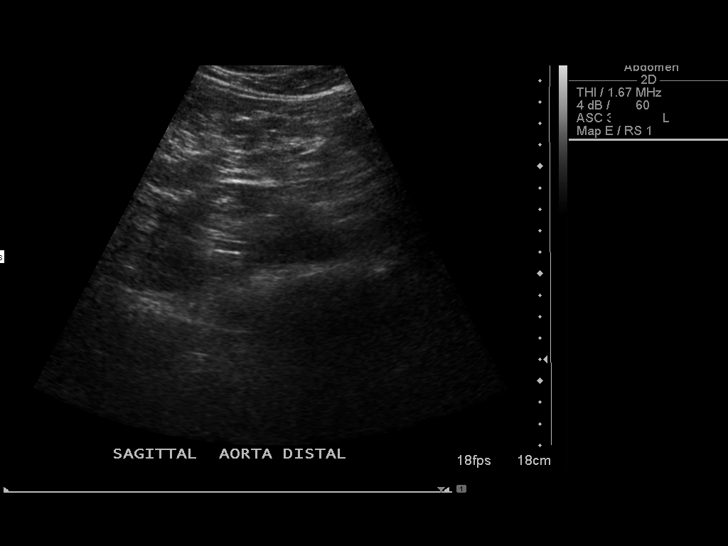
[im 14/83]
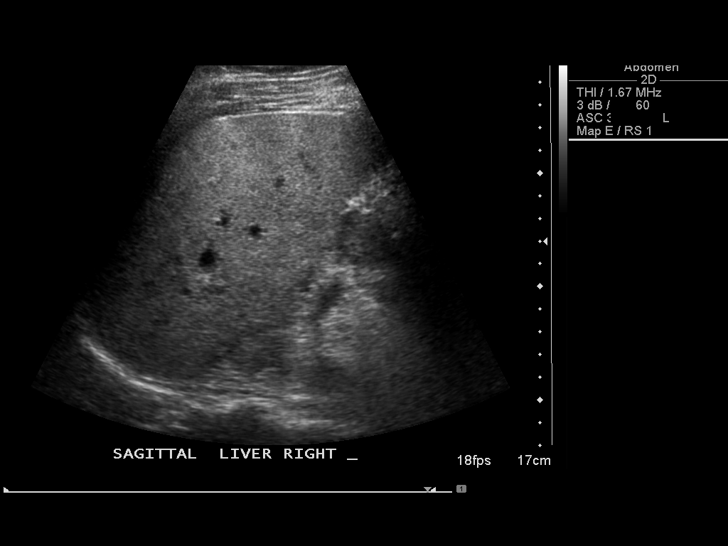
[im 21/83]
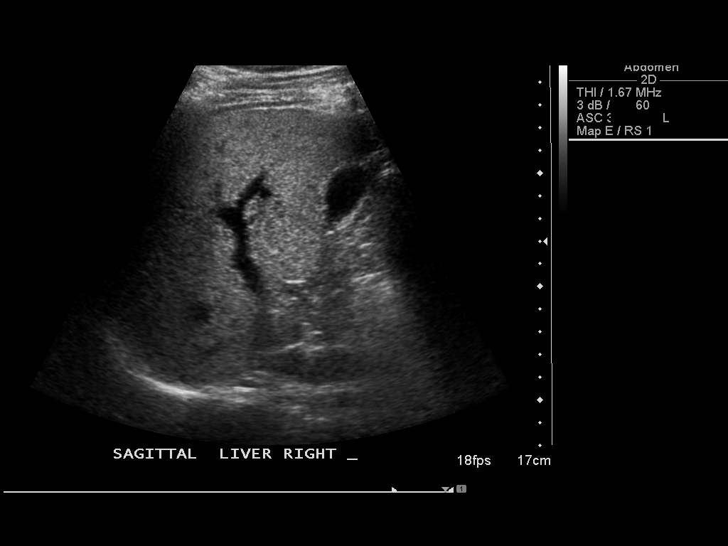
[im 28/83]
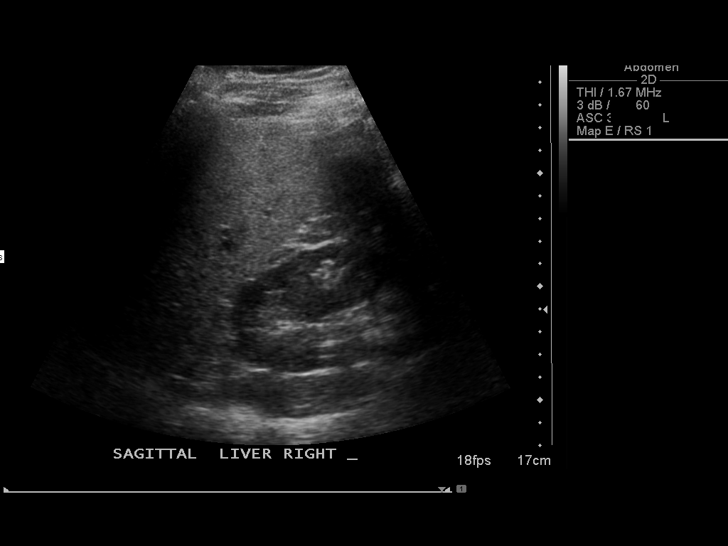
[im 31/83]
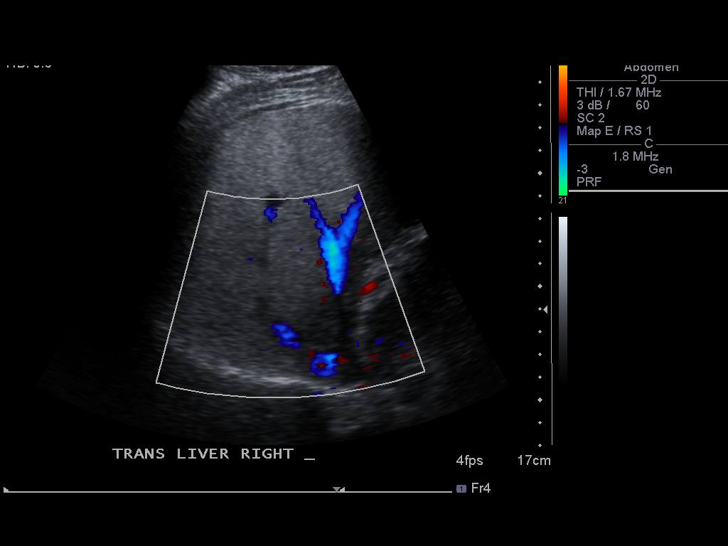
[im 38/83]
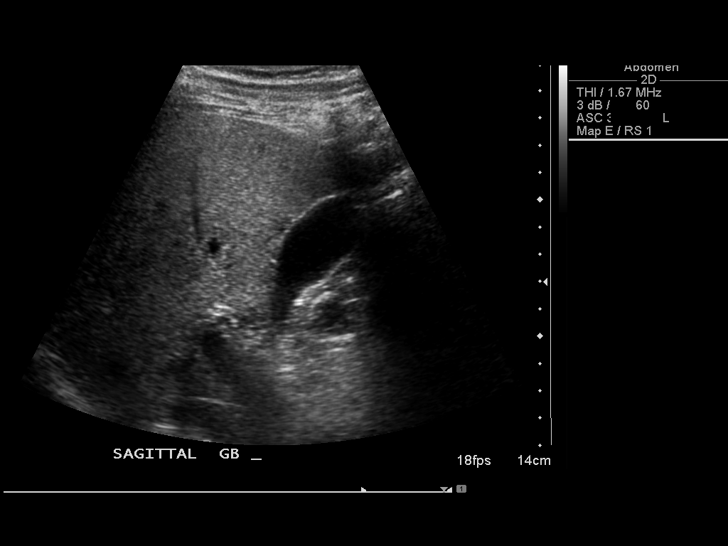
[im 45/83]
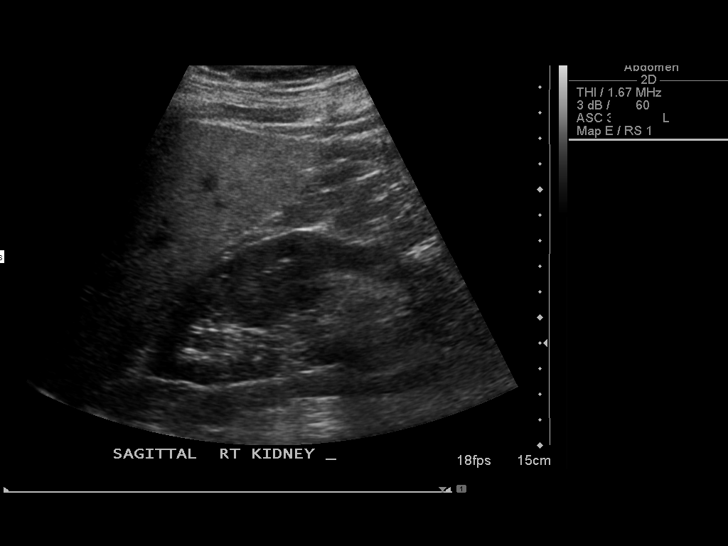
[im 52/83]
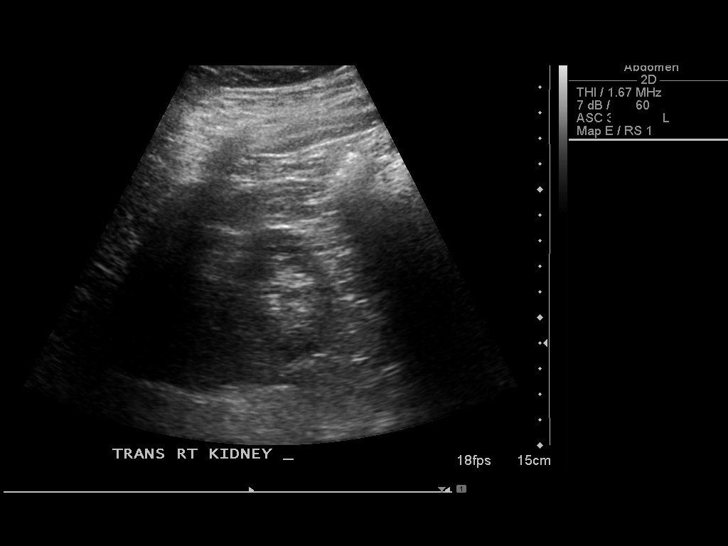
[im 55/83]
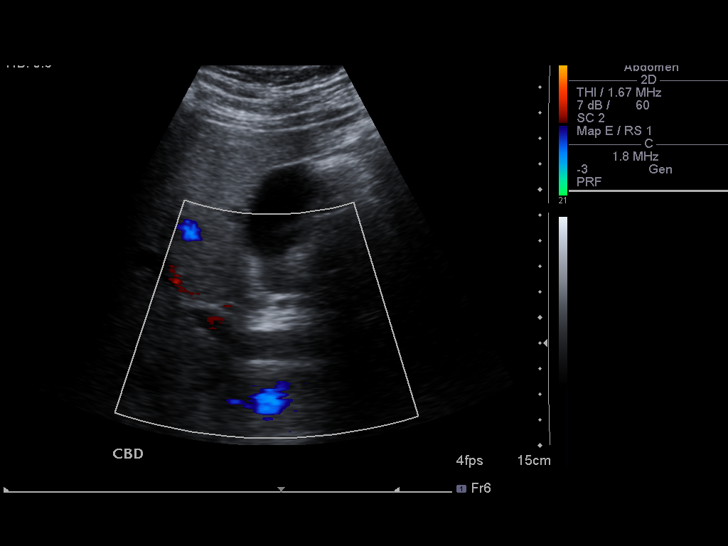
[im 62/83]
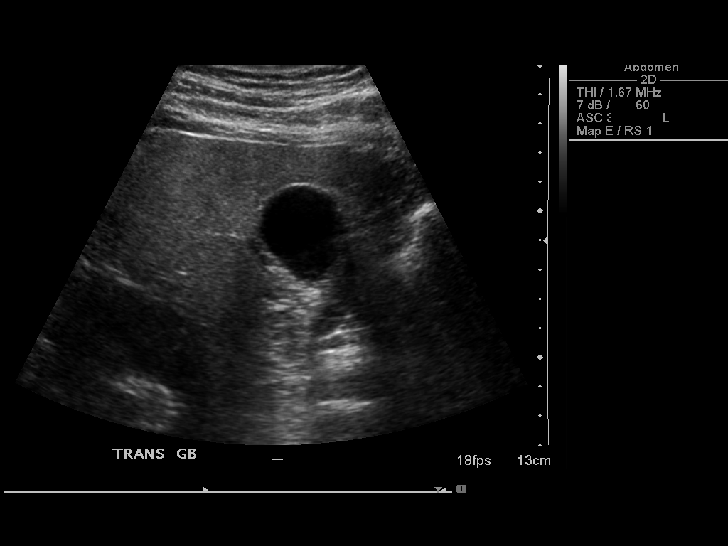
[im 69/83]
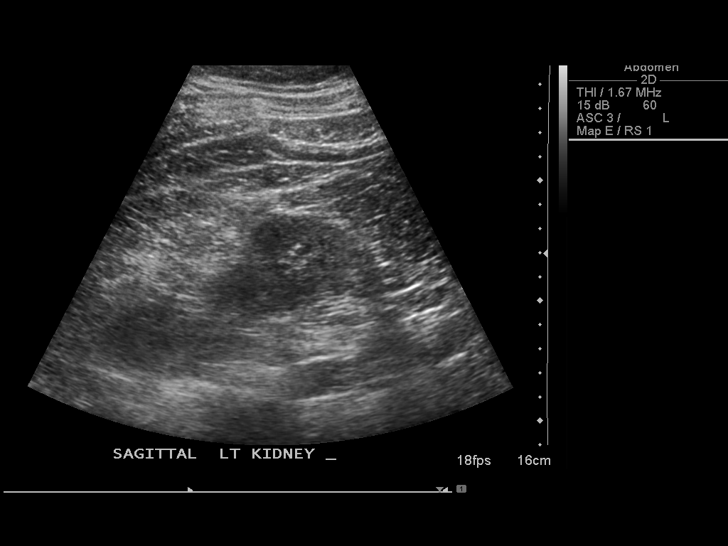
[im 76/83]
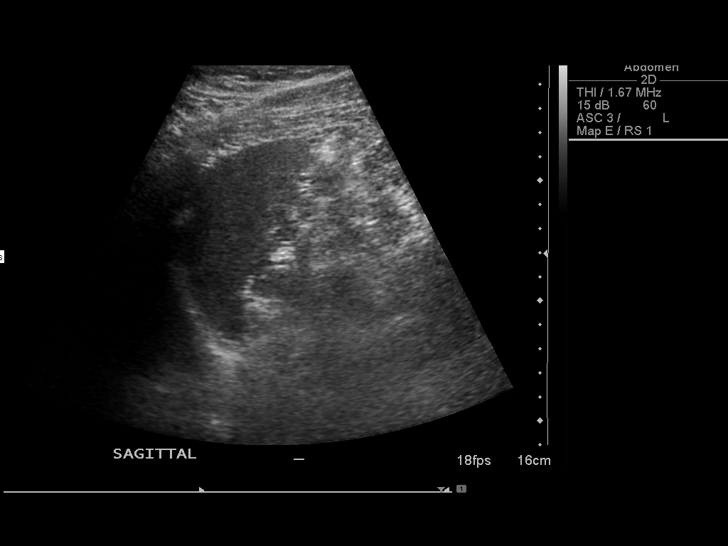
[im 83/83]
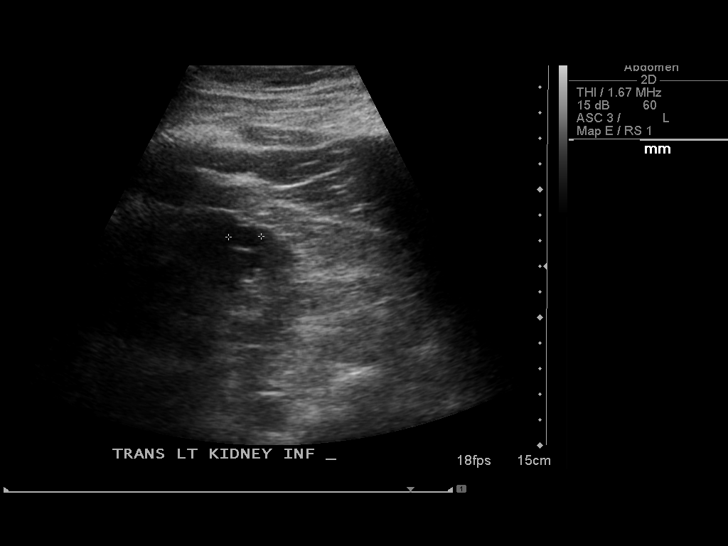

[14 of 25 positions shown; findings below may reference images not displayed]

FINDINGS: Gallbladder:  No gallstones, gallbladder wall thickening, or
pericholecystic fluid.

Common bile duct:  Measures 2.5 mm and appears normal.

Liver:  No focal lesion identified.  The right hepatic lobe
demonstrates increased echogenicity.  Left hepatic lobe is not well
seen.

IVC:  Appears normal.

Pancreas:  No focal abnormality seen.

Spleen:  Measures 6.7 cm and appears normal.

Right Kidney:  Measures 12.0 cm and appears normal.

Left Kidney:  Measures 12.1 cm.  A single simple cyst measuring
cm in the mid pole noted.  No stone or hydronephrosis.

Abdominal aorta:  No aneurysm identified.
IMPRESSION: Increased echogenicity of the right hepatic lobe compatible fatty
infiltration.  The examination is otherwise negative.

## 2013-01-24 ENCOUNTER — Telehealth: Payer: Self-pay | Admitting: Internal Medicine

## 2013-01-24 NOTE — Telephone Encounter (Signed)
r/s lab today to 01/29/13

## 2013-01-25 ENCOUNTER — Other Ambulatory Visit: Payer: BC Managed Care – PPO | Admitting: Lab

## 2013-01-28 ENCOUNTER — Other Ambulatory Visit: Payer: Self-pay

## 2013-01-28 DIAGNOSIS — R7989 Other specified abnormal findings of blood chemistry: Secondary | ICD-10-CM

## 2013-01-29 ENCOUNTER — Other Ambulatory Visit (HOSPITAL_BASED_OUTPATIENT_CLINIC_OR_DEPARTMENT_OTHER): Payer: BC Managed Care – PPO | Admitting: Lab

## 2013-01-29 DIAGNOSIS — R7989 Other specified abnormal findings of blood chemistry: Secondary | ICD-10-CM

## 2013-01-29 LAB — CBC WITH DIFFERENTIAL/PLATELET
BASO%: 0.8 % (ref 0.0–2.0)
EOS%: 2.2 % (ref 0.0–7.0)
MCH: 31.1 pg (ref 27.2–33.4)
MCHC: 33.9 g/dL (ref 32.0–36.0)
NEUT%: 55 % (ref 39.0–75.0)
RBC: 5.29 10*6/uL (ref 4.20–5.82)
RDW: 12.9 % (ref 11.0–14.6)
WBC: 6.4 10*3/uL (ref 4.0–10.3)
lymph#: 2.2 10*3/uL (ref 0.9–3.3)

## 2013-01-30 ENCOUNTER — Telehealth: Payer: Self-pay

## 2013-01-30 LAB — FERRITIN CHCC: Ferritin: 90 ng/ml (ref 22–316)

## 2013-01-30 NOTE — Telephone Encounter (Signed)
Mailed ferritin results from 9/16 per pt request.

## 2013-04-19 ENCOUNTER — Ambulatory Visit: Payer: BC Managed Care – PPO

## 2013-04-19 ENCOUNTER — Other Ambulatory Visit: Payer: BC Managed Care – PPO | Admitting: Lab

## 2013-07-19 ENCOUNTER — Other Ambulatory Visit: Payer: Self-pay | Admitting: Medical Oncology

## 2013-07-19 ENCOUNTER — Encounter: Payer: Self-pay | Admitting: Medical Oncology

## 2013-07-19 DIAGNOSIS — D751 Secondary polycythemia: Secondary | ICD-10-CM

## 2013-07-19 DIAGNOSIS — R7989 Other specified abnormal findings of blood chemistry: Secondary | ICD-10-CM

## 2013-07-19 NOTE — Progress Notes (Signed)
Pt called Jose Stanley- lab receptionist asking if her can come in for labs. She explained that he does not have any orders. I looked at pt's chart and he has not been seen since June 2014. He cancelled his appointments in December. I explained that Dr. Juliann Mule can not just order labs and treatment on a patient that has not been seen in almost 9 months. I made POF for pt. Jose Stanley was to notify the pt.

## 2013-07-22 ENCOUNTER — Telehealth: Payer: Self-pay | Admitting: Internal Medicine

## 2013-07-22 NOTE — Telephone Encounter (Signed)
, °

## 2013-07-26 ENCOUNTER — Telehealth: Payer: Self-pay | Admitting: Internal Medicine

## 2013-07-26 ENCOUNTER — Ambulatory Visit (HOSPITAL_BASED_OUTPATIENT_CLINIC_OR_DEPARTMENT_OTHER): Payer: BC Managed Care – PPO | Admitting: Internal Medicine

## 2013-07-26 ENCOUNTER — Other Ambulatory Visit (HOSPITAL_BASED_OUTPATIENT_CLINIC_OR_DEPARTMENT_OTHER): Payer: BC Managed Care – PPO

## 2013-07-26 DIAGNOSIS — E876 Hypokalemia: Secondary | ICD-10-CM

## 2013-07-26 DIAGNOSIS — D751 Secondary polycythemia: Secondary | ICD-10-CM

## 2013-07-26 DIAGNOSIS — R7989 Other specified abnormal findings of blood chemistry: Secondary | ICD-10-CM

## 2013-07-26 LAB — COMPREHENSIVE METABOLIC PANEL (CC13)
ALT: 25 U/L (ref 0–55)
ANION GAP: 12 meq/L — AB (ref 3–11)
AST: 25 U/L (ref 5–34)
Albumin: 4.1 g/dL (ref 3.5–5.0)
Alkaline Phosphatase: 67 U/L (ref 40–150)
BILIRUBIN TOTAL: 1.11 mg/dL (ref 0.20–1.20)
BUN: 14.1 mg/dL (ref 7.0–26.0)
CALCIUM: 9.8 mg/dL (ref 8.4–10.4)
CHLORIDE: 104 meq/L (ref 98–109)
CO2: 27 meq/L (ref 22–29)
CREATININE: 1.3 mg/dL (ref 0.7–1.3)
Glucose: 115 mg/dl (ref 70–140)
Potassium: 3.3 mEq/L — ABNORMAL LOW (ref 3.5–5.1)
Sodium: 142 mEq/L (ref 136–145)
Total Protein: 7.4 g/dL (ref 6.4–8.3)

## 2013-07-26 LAB — CBC WITH DIFFERENTIAL/PLATELET
BASO%: 0.4 % (ref 0.0–2.0)
BASOS ABS: 0 10*3/uL (ref 0.0–0.1)
EOS%: 1.6 % (ref 0.0–7.0)
Eosinophils Absolute: 0.1 10*3/uL (ref 0.0–0.5)
HEMATOCRIT: 49 % (ref 38.4–49.9)
HEMOGLOBIN: 16.6 g/dL (ref 13.0–17.1)
LYMPH%: 38.5 % (ref 14.0–49.0)
MCH: 31.1 pg (ref 27.2–33.4)
MCHC: 34 g/dL (ref 32.0–36.0)
MCV: 91.6 fL (ref 79.3–98.0)
MONO#: 0.7 10*3/uL (ref 0.1–0.9)
MONO%: 9.8 % (ref 0.0–14.0)
NEUT#: 3.4 10*3/uL (ref 1.5–6.5)
NEUT%: 49.7 % (ref 39.0–75.0)
PLATELETS: 233 10*3/uL (ref 140–400)
RBC: 5.35 10*6/uL (ref 4.20–5.82)
RDW: 13 % (ref 11.0–14.6)
WBC: 6.9 10*3/uL (ref 4.0–10.3)
lymph#: 2.7 10*3/uL (ref 0.9–3.3)

## 2013-07-26 NOTE — Telephone Encounter (Signed)
Gave pt appt for lab and  MD for June and September , emailed michelle regarding phlebotomy

## 2013-07-28 NOTE — Progress Notes (Signed)
[place], MD [place] [address]  DIAGNOSIS: Hemochromatosis - Plan: CBC with Differential, Ferritin, Iron and TIBC, CBC with Differential, Comprehensive metabolic panel (Cmet) - [facility], Ferritin, Iron and TIBC  Elevated ferritin level  Erythrocytosis - Plan: CBC with Differential, Ferritin, Iron and TIBC, CBC with Differential, Comprehensive metabolic panel (Cmet) - [facility], Ferritin, Iron and TIBC  Chief Complaint  Patient presents with  . Elevated ferritin level    CURRENT TREATMENT: Phlebotomy to maintain a ferrin less than 100.   INTERVAL HISTORY: Jose Stanley 48 y.o. male with a history secondary to hemochromatosis despite the absence of mutations in the HFE gene is here for follow up.  He was last seen by Dr. Ralene Ok on 10/18/2012. Thhe patient is here today for further re-evaluation. The patient was empirically scheduled for phlebotomy. Clinically he feels fine. He drives an 90 wheeler in the [place] the country. He is without any complaints today. Of note is the fact that his wife is a Marine scientist and his son is a Software engineer.  MEDICAL HISTORY: Past Medical History  Diagnosis Date  . Elevated ferritin   . Erythrocytosis     INTERIM HISTORY: has Elevated ferritin level; Erythrocytosis; and Hemochromatosis on his problem list.    ALLERGIES:  is allergic to penicillins.  MEDICATIONS: has a current medication list which includes the following prescription(s): acetaminophen, chlorthalidone, and glucosamine-chondroitin.  SURGICAL HISTORY: No past surgical history on file.   1. Elevated ferritin level, possibly due to hemochromatosis although the HFE gene mutations, specifically the C2A2Y and H63D mutations were not detected. The patient has undergone 1 unit phlebotomies beginning on June 15, 2010, 07/13/2010, 08/06/2010, 10/05/2010, 11/02/2010, and 11/30/2010. Ferritin level on 03/12/2010 was 759.  It was felt that the patient had hemochromatosis on the basis of a rising ferritin level without obvious explanation. The patient denied any supplemental iron, and we had no other alternative for the elevated ferritin. Specifically, there was no evidence for infection, inflammation or cancer.  2. Erythrocytosis detected February 2011. JAK2 mutation was not detected. Abdominal ultrasound on 06/24/2010 did not show  splenomegaly.  3. Hypertension.  4. GERD.  5. Mild renal insufficiency.  6. 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   REVIEW OF SYSTEMS:   Constitutional: Denies fevers, chills or abnormal weight loss Eyes: Denies blurriness of vision Ears, nose, mouth, throat, and face: Denies mucositis or sore throat Respiratory: Denies cough, dyspnea or wheezes Cardiovascular: Denies palpitation, chest discomfort or lower extremity swelling Gastrointestinal:  Denies nausea, heartburn or change in bowel habits Skin: Denies abnormal skin rashes Lymphatics: Denies new lymphadenopathy or easy bruising Neurological:Denies numbness, tingling or new weaknesses Behavioral/Psych: Mood is stable, no new changes  All other systems were reviewed with the patient and are negative.  PHYSICAL EXAMINATION: ECOG PERFORMANCE STATUS: 0 - Asymptomatic  Blood pressure 127/73, pulse 86, temperature 98.5 F (36.9 C), temperature source Oral, resp. rate 20, height 5\' 9"  (1.753 m), weight 227 lb 6.4 oz (103.148 kg), SpO2 100.00%.  GENERAL:alert, no distress and comfortable SKIN: skin color, texture, turgor are normal, no rashes or significant lesions EYES: normal, Conjunctiva are pink and non-injected, sclera clear OROPHARYNX:no exudate, no erythema and lips, buccal mucosa, and tongue normal  NECK: supple, thyroid normal size, non-tender, without nodularity LYMPH:  no palpable lymphadenopathy in the cervical, axillary or supraclavicular LUNGS: clear to auscultation with normal breathing  effort, no wheezes or rhonchi HEART: regular rate & rhythm and  no murmurs and no lower extremity edema ABDOMEN:abdomen soft, non-tender and normal bowel sounds Musculoskeletal:no cyanosis of digits and no clubbing  NEURO: alert & oriented x 3 with fluent speech, no focal motor/sensory deficits  Labs:  Lab Results  Component Value Date   WBC 6.9 07/26/2013   HGB 16.6 07/26/2013   HCT 49.0 07/26/2013   MCV 91.6 07/26/2013   PLT 233 07/26/2013   NEUTROABS 3.4 07/26/2013      Chemistry      Component Value Date/Time   NA 142 07/26/2013 1527   NA 141 06/28/2011 1540   K 3.3* 07/26/2013 1527   K 3.3* 06/28/2011 1540   CL 106 10/18/2012 1425   CL 101 06/28/2011 1540   CO2 27 07/26/2013 1527   CO2 31 06/28/2011 1540   BUN 14.1 07/26/2013 1527   BUN 15 06/28/2011 1540   CREATININE 1.3 07/26/2013 1527   CREATININE 1.52* 06/28/2011 1540      Component Value Date/Time   CALCIUM 9.8 07/26/2013 1527   CALCIUM 10.1 06/28/2011 1540   ALKPHOS 67 07/26/2013 1527   ALKPHOS 73 06/28/2011 1540   AST 25 07/26/2013 1527   AST 32 06/28/2011 1540   ALT 25 07/26/2013 1527   ALT 42 06/28/2011 1540   BILITOT 1.11 07/26/2013 1527   BILITOT 1.0 06/28/2011 1540       Basic Metabolic Panel:  Recent Labs Lab 07/26/13 1527  NA 142  K 3.3*  CO2 27  GLUCOSE 115  BUN 14.1  CREATININE 1.3  CALCIUM 9.8   GFR Estimated Creatinine Clearance: 83.2 ml/min (by C-G formula based on Cr of 1.3). Liver Function Tests:  Recent Labs Lab 07/26/13 1527  AST 25  ALT 25  ALKPHOS 67  BILITOT 1.11  PROT 7.4  ALBUMIN 4.1   CBC:  Recent Labs Lab 07/26/13 1526  WBC 6.9  NEUTROABS 3.4  HGB 16.6  HCT 49.0  MCV 91.6  PLT 233    Anemia work up Iron/TIBC/Ferritin    Component Value Date/Time   IRON 135 10/18/2012 1425   TIBC 336 10/18/2012 1425   FERRITIN 90 01/29/2013 1512   FERRITIN 171 10/18/2012 1425   Studies:  No results found.   RADIOGRAPHIC STUDIES: 1. Ultrasound of the abdomen on 06/24/2010 showed a   normal size spleen measuring 6.7 cm. Right kidney was normal. There was a single simple cyst measuring 1.3 cm in the midpole of the left kidney without hydronephrosis or stone. Liver showed no focal lesions. The right hepatic lobe demonstrated increased echogenicity. The left hepatic lobe was not well visualized.   ASSESSMENT: Jose Stanley 48 y.o. male with a history of Hemochromatosis - Plan: CBC with Differential, Ferritin, Iron and TIBC, CBC with Differential, Comprehensive metabolic panel (Cmet) - [facility], Ferritin, Iron and TIBC  Elevated ferritin level  Erythrocytosis - Plan: CBC with Differential, Ferritin, Iron and TIBC, CBC with Differential, Comprehensive metabolic panel (Cmet) - [facility], Ferritin, Iron and TIBC   PLAN:   1.  Hemochromatosis. --The patient is agreeable have a phlebotomy on Tuesday of next week.  He reports that his ferritin was in the 200s at his PCP's office.  We will check CBC and ferritin in 3 months and if the ferritin is greater than 100, the patient will be contacted and he will come back for phlebotomy.  --He understands the rationale behind the patient's phlebotomy  Program including that  no mutations were found and that but he dose have a significantly elevated ferritin for  no obvious explanation and it appeared that the ferritin was slowly increasing when we observed labs for several months.  2. Hypokalemia, mild.  --Likely secondary to his bp meds, i.e., chlorthalidone.  Counseled on symptoms of hypokalemia and provided a handout.  He will discuss with his PCP if this problems persists.   All questions were answered. The patient knows to call the clinic with any problems, questions or concerns. We can certainly see the patient much sooner if necessary.  I spent 10 minutes counseling the patient face to face. The total time spent in the appointment was 15 minutes.    Tane Biegler, MD 07/28/2013 7:25 PM

## 2013-07-29 LAB — IRON AND TIBC CHCC
%SAT: 31 % (ref 20–55)
IRON: 107 ug/dL (ref 42–163)
TIBC: 348 ug/dL (ref 202–409)
UIBC: 241 ug/dL (ref 117–376)

## 2013-07-29 LAB — FERRITIN CHCC: Ferritin: 263 ng/ml (ref 22–316)

## 2013-07-30 ENCOUNTER — Telehealth: Payer: Self-pay | Admitting: Internal Medicine

## 2013-07-30 ENCOUNTER — Other Ambulatory Visit: Payer: Self-pay | Admitting: Medical Oncology

## 2013-07-30 ENCOUNTER — Telehealth: Payer: Self-pay | Admitting: *Deleted

## 2013-07-30 NOTE — Telephone Encounter (Signed)
Per staff message and POF I have scheduled appts.  JMW  

## 2013-07-30 NOTE — Telephone Encounter (Signed)
lmonvm advising the pt of his phlebotomy appt on 07/31/2013@2 :45pm

## 2013-07-30 NOTE — Telephone Encounter (Signed)
Talked to pt he is aware of all his appt for [facility] and beyond

## 2013-07-31 ENCOUNTER — Ambulatory Visit (HOSPITAL_BASED_OUTPATIENT_CLINIC_OR_DEPARTMENT_OTHER): Payer: BC Managed Care – PPO

## 2013-07-31 NOTE — Progress Notes (Signed)
Phlebotomy performed using 16 gauge phlebotomy set with no complications.  Procedure started at 1683, 562g removed, ending at 1529 with pressure dressing applied.  Pt had no complaints or concerns.  Pt given water to drink, snacks were refused, and patient observed for 15 min. With no problems.  Pt would only agree to stay for 15 minutes.

## 2013-07-31 NOTE — Patient Instructions (Signed)

## 2013-10-25 ENCOUNTER — Other Ambulatory Visit: Payer: BC Managed Care – PPO

## 2014-01-16 ENCOUNTER — Telehealth: Payer: Self-pay | Admitting: Hematology

## 2014-01-16 NOTE — Telephone Encounter (Signed)
Lvm advising appt 9/14 moved to 9/15 due to md on call.

## 2014-01-27 ENCOUNTER — Ambulatory Visit: Payer: BC Managed Care – PPO

## 2014-01-27 ENCOUNTER — Other Ambulatory Visit: Payer: BC Managed Care – PPO

## 2014-01-28 ENCOUNTER — Ambulatory Visit: Payer: BC Managed Care – PPO

## 2014-01-28 ENCOUNTER — Other Ambulatory Visit: Payer: BC Managed Care – PPO

## 2016-11-04 ENCOUNTER — Other Ambulatory Visit: Payer: Self-pay | Admitting: Nurse Practitioner

## 2017-01-14 ENCOUNTER — Other Ambulatory Visit: Payer: Self-pay | Admitting: Nurse Practitioner

## 2017-11-28 ENCOUNTER — Encounter: Payer: Self-pay | Admitting: Gastroenterology

## 2017-12-18 ENCOUNTER — Ambulatory Visit (AMBULATORY_SURGERY_CENTER): Payer: Self-pay

## 2017-12-18 VITALS — Ht 68.0 in | Wt 247.4 lb

## 2017-12-18 DIAGNOSIS — Z1211 Encounter for screening for malignant neoplasm of colon: Secondary | ICD-10-CM

## 2017-12-18 MED ORDER — SOD PICOSULFATE-MAG OX-CIT ACD 10-3.5-12 MG-GM -GM/160ML PO SOLN: 1.0000 | Freq: Once | ORAL | 0 refills | Status: AC

## 2017-12-18 NOTE — Progress Notes (Signed)
Denies allergies to eggs or soy products. Denies complication of anesthesia or sedation. Denies use of weight loss medication. Denies use of O2.   Emmi instructions declined.  

## 2017-12-27 ENCOUNTER — Encounter: Payer: Self-pay | Admitting: Gastroenterology

## 2018-01-01 ENCOUNTER — Ambulatory Visit (AMBULATORY_SURGERY_CENTER): Payer: BLUE CROSS/BLUE SHIELD | Admitting: Gastroenterology

## 2018-01-01 ENCOUNTER — Encounter: Payer: Self-pay | Admitting: Gastroenterology

## 2018-01-01 VITALS — BP 133/76 | HR 71 | Temp 99.1°F | Resp 15 | Ht 68.0 in | Wt 247.0 lb

## 2018-01-01 DIAGNOSIS — Z1211 Encounter for screening for malignant neoplasm of colon: Secondary | ICD-10-CM | POA: Diagnosis present

## 2018-01-01 DIAGNOSIS — D123 Benign neoplasm of transverse colon: Secondary | ICD-10-CM | POA: Diagnosis not present

## 2018-01-01 NOTE — Progress Notes (Signed)
Report to PACU, RN, vss, BBS= Clear.  

## 2018-01-01 NOTE — Progress Notes (Signed)
Pt. Reports no change in his medical or surgical history since his pre-visit 12/18/2017.

## 2018-01-01 NOTE — Progress Notes (Signed)
Called to room to assist during endoscopic procedure.  Patient ID and intended procedure confirmed with present staff. Received instructions for my participation in the procedure from the performing physician.  

## 2018-01-01 NOTE — Op Note (Signed)
Jose Stanley Patient Name: Jose Stanley Procedure Date: 01/01/2018 1:59 PM MRN: [id number] Endoscopist: Jackquline Denmark , MD Age: 52 Referring MD:  Date of Birth: 1966-04-21 Gender: Male Account #: [id number] Procedure:                Colonoscopy Indications:              Screening for colorectal malignant neoplasm Medicines:                Monitored Anesthesia Care Procedure:                Pre-Anesthesia Assessment:                           - Prior to the procedure, a History and Physical                            was performed, and patient medications and                            allergies were reviewed. The patient's tolerance of                            previous anesthesia was also reviewed. The risks                            and benefits of the procedure and the sedation                            options and risks were discussed with the patient.                            All questions were answered, and informed consent                            was obtained. Prior Anticoagulants: The patient has                            taken no previous anticoagulant or antiplatelet                            agents. ASA Grade Assessment: II - A patient with                            mild systemic disease. After reviewing the risks                            and benefits, the patient was deemed in                            satisfactory condition to undergo the procedure.                           After obtaining informed consent, the colonoscope  was passed under direct vision. Throughout the                            procedure, the patient's blood pressure, pulse, and                            oxygen saturations were monitored continuously. The                            Colonoscope was introduced through the anus and                            advanced to the the cecum, identified by                            appendiceal orifice and ileocecal  valve. The                            colonoscopy was performed without difficulty. The                            patient tolerated the procedure well. The quality                            of the bowel preparation was excellent. Scope In: 2:03:51 PM Scope Out: 2:15:17 PM Scope Withdrawal Time: 0 hours 7 minutes 40 seconds  Total Procedure Duration: 0 hours 11 minutes 26 seconds  Findings:                 A 6 mm polyp was found in the proximal transverse                            colon. The polyp was sessile. The polyp was removed                            with a cold snare. Resection and retrieval were                            complete. Estimated blood loss: none.                           The exam was otherwise without abnormality on                            direct and retroflexion views. Complications:            No immediate complications. Estimated Blood Loss:     Estimated blood loss: none. Impression:               - One 6 mm polyp in the proximal transverse colon,                            removed with a cold snare. Resected and retrieved.                           -  The examination was otherwise normal on direct                            and retroflexion views. Recommendation:           - Patient has a contact number available for                            emergencies. The signs and symptoms of potential                            delayed complications were discussed with the                            patient. Return to normal activities tomorrow.                            Written discharge instructions were provided to the                            patient.                           - Resume previous diet.                           - Continue present medications.                           - Await pathology results.                           - Repeat colonoscopy for surveillance based on                            pathology results.                           -  Return to GI clinic PRN. Jackquline Denmark, MD 01/01/2018 2:18:43 PM This report has been signed electronically.

## 2018-01-01 NOTE — Patient Instructions (Signed)
Information on polyps given.   YOU HAD AN ENDOSCOPIC PROCEDURE TODAY AT THE [facility]:   Refer to the procedure report that was given to you for any specific questions about what was found during the examination.  If the procedure report does not answer your questions, please call your gastroenterologist to clarify.  If you requested that your care partner not be given the details of your procedure findings, then the procedure report has been included in a sealed envelope for you to review at your convenience later.  YOU SHOULD EXPECT: Some feelings of bloating in the abdomen. Passage of more gas than usual.  Walking can help get rid of the air that was put into your GI tract during the procedure and reduce the bloating. If you had a lower endoscopy (such as a colonoscopy or flexible sigmoidoscopy) you may notice spotting of blood in your stool or on the toilet paper. If you underwent a bowel prep for your procedure, you may not have a normal bowel movement for a few days.  Please Note:  You might notice some irritation and congestion in your nose or some drainage.  This is from the oxygen used during your procedure.  There is no need for concern and it should clear up in a day or so.  SYMPTOMS TO REPORT IMMEDIATELY:   Following lower endoscopy (colonoscopy or flexible sigmoidoscopy):  Excessive amounts of blood in the stool  Significant tenderness or worsening of abdominal pains  Swelling of the abdomen that is new, acute  Fever of 100F or higher     For urgent or emergent issues, a gastroenterologist can be reached at any hour by calling [phone].   DIET:  We do recommend a small meal at first, but then you may proceed to your regular diet.  Drink plenty of fluids but you should avoid alcoholic beverages for 24 hours.  ACTIVITY:  You should plan to take it easy for the rest of today and you should NOT DRIVE or use heavy machinery until tomorrow (because of the  sedation medicines used during the test).    FOLLOW UP: Our staff will call the number listed on your records the next business day following your procedure to check on you and address any questions or concerns that you may have regarding the information given to you following your procedure. If we do not reach you, we will leave a message.  However, if you are feeling well and you are not experiencing any problems, there is no need to return our call.  We will assume that you have returned to your regular daily activities without incident.  If any biopsies were taken you will be contacted by phone or by letter within the next 1-3 weeks.  Please call us at [phone] if you have not heard about the biopsies in 3 weeks.    SIGNATURES/CONFIDENTIALITY: You and/or your care partner have signed paperwork which will be entered into your electronic medical record.  These signatures attest to the fact that that the information above on your After Visit Summary has been reviewed and is understood.  Full responsibility of the confidentiality of this discharge information lies with you and/or your care-partner. 

## 2018-01-02 ENCOUNTER — Telehealth: Payer: Self-pay

## 2018-01-02 NOTE — Telephone Encounter (Signed)
No answer, left message, will call back later today, B.Mattson Dayal RN

## 2018-01-02 NOTE — Telephone Encounter (Signed)
Called [phone] and left a messaged we tried to reach pt for a follow up call. maw

## 2018-01-06 ENCOUNTER — Encounter: Payer: Self-pay | Admitting: Gastroenterology
